# Patient Record
Sex: Female | Born: 1945 | Hispanic: No | Marital: Married
Health system: Southern US, Community
[De-identification: ages and names within clinical notes are randomized; demographics above are authoritative.]

## PROBLEM LIST (undated history)

## (undated) DIAGNOSIS — G4733 Obstructive sleep apnea (adult) (pediatric): Secondary | ICD-10-CM

## (undated) DIAGNOSIS — C109 Malignant neoplasm of oropharynx, unspecified: Secondary | ICD-10-CM

## (undated) DIAGNOSIS — J9621 Acute and chronic respiratory failure with hypoxia: Secondary | ICD-10-CM

## (undated) DIAGNOSIS — Z930 Tracheostomy status: Secondary | ICD-10-CM

## (undated) DIAGNOSIS — D869 Sarcoidosis, unspecified: Secondary | ICD-10-CM

## (undated) HISTORY — DX: Obstructive sleep apnea (adult) (pediatric): G47.33

## (undated) HISTORY — DX: Tracheostomy status: Z93.0

## (undated) HISTORY — DX: Sarcoidosis, unspecified: D86.9

## (undated) HISTORY — DX: Malignant neoplasm of oropharynx, unspecified: C10.9

## (undated) HISTORY — DX: Acute and chronic respiratory failure with hypoxia: J96.21

---

## 2020-01-03 ENCOUNTER — Inpatient Hospital Stay
Admission: RE | Admit: 2020-01-03 | Discharge: 2020-01-15 | Disposition: A | Discharge status: Skilled Nursing Facility | Payer: Medicare Other | Source: Other Acute Inpatient Hospital

## 2020-01-03 ENCOUNTER — Inpatient Hospital Stay: Admission: RE | Admit: 2020-01-03 | Payer: Medicare Other | Source: Ambulatory Visit

## 2020-01-03 ENCOUNTER — Other Ambulatory Visit (HOSPITAL_COMMUNITY): Payer: Medicare Other

## 2020-01-03 DIAGNOSIS — J189 Pneumonia, unspecified organism: Secondary | ICD-10-CM

## 2020-01-03 DIAGNOSIS — J9621 Acute and chronic respiratory failure with hypoxia: Secondary | ICD-10-CM | POA: Diagnosis present

## 2020-01-03 DIAGNOSIS — Z431 Encounter for attention to gastrostomy: Secondary | ICD-10-CM

## 2020-01-03 DIAGNOSIS — G4733 Obstructive sleep apnea (adult) (pediatric): Secondary | ICD-10-CM | POA: Diagnosis present

## 2020-01-03 DIAGNOSIS — D869 Sarcoidosis, unspecified: Secondary | ICD-10-CM | POA: Diagnosis present

## 2020-01-03 DIAGNOSIS — Z930 Tracheostomy status: Secondary | ICD-10-CM

## 2020-01-03 DIAGNOSIS — C109 Malignant neoplasm of oropharynx, unspecified: Secondary | ICD-10-CM | POA: Diagnosis present

## 2020-01-03 HISTORY — DX: Sarcoidosis, unspecified: D86.9

## 2020-01-03 HISTORY — DX: Obstructive sleep apnea (adult) (pediatric): G47.33

## 2020-01-03 HISTORY — DX: Acute and chronic respiratory failure with hypoxia: J96.21

## 2020-01-03 HISTORY — DX: Tracheostomy status: Z93.0

## 2020-01-03 HISTORY — DX: Malignant neoplasm of oropharynx, unspecified: C10.9

## 2020-01-03 MED ORDER — IOHEXOL 350 MG/ML SOLN: 50.0000 mL | Freq: Once | INTRAVENOUS | Status: DC | PRN

## 2020-01-04 LAB — COMPREHENSIVE METABOLIC PANEL
ALT: 12 U/L (ref 0–44)
AST: 20 U/L (ref 15–41)
Albumin: 2.2 g/dL — ABNORMAL LOW (ref 3.5–5.0)
Alkaline Phosphatase: 55 U/L (ref 38–126)
Anion gap: 11 (ref 5–15)
BUN: 21 mg/dL (ref 8–23)
CO2: 32 mmol/L (ref 22–32)
Calcium: 8.8 mg/dL — ABNORMAL LOW (ref 8.9–10.3)
Chloride: 103 mmol/L (ref 98–111)
Creatinine, Ser: 1.17 mg/dL — ABNORMAL HIGH (ref 0.44–1.00)
GFR calc Af Amer: 54 mL/min — ABNORMAL LOW (ref >60)
GFR calc non Af Amer: 46 mL/min — ABNORMAL LOW (ref >60)
Glucose, Bld: 133 mg/dL — ABNORMAL HIGH (ref 70–99)
Potassium: 3.5 mmol/L (ref 3.5–5.1)
Sodium: 146 mmol/L — ABNORMAL HIGH (ref 135–145)
Total Bilirubin: 0.4 mg/dL (ref 0.3–1.2)
Total Protein: 5.6 g/dL — ABNORMAL LOW (ref 6.5–8.1)

## 2020-01-04 LAB — URINALYSIS, ROUTINE W REFLEX MICROSCOPIC
Bacteria, UA: NONE SEEN
Bilirubin Urine: NEGATIVE
Glucose, UA: NEGATIVE mg/dL
Hgb urine dipstick: NEGATIVE
Ketones, ur: NEGATIVE mg/dL
Leukocytes,Ua: NEGATIVE
Nitrite: NEGATIVE
Protein, ur: 30 mg/dL — AB
Specific Gravity, Urine: 1.015 (ref 1.005–1.030)
pH: 6 (ref 5.0–8.0)

## 2020-01-04 LAB — CBC WITH DIFFERENTIAL/PLATELET
Abs Immature Granulocytes: 0.02 10*3/uL (ref 0.00–0.07)
Basophils Absolute: 0 10*3/uL (ref 0.0–0.1)
Basophils Relative: 0 %
Eosinophils Absolute: 0.2 10*3/uL (ref 0.0–0.5)
Eosinophils Relative: 3 %
HCT: 27.9 % — ABNORMAL LOW (ref 36.0–46.0)
Hemoglobin: 8.7 g/dL — ABNORMAL LOW (ref 12.0–15.0)
Immature Granulocytes: 0 %
Lymphocytes Relative: 17 %
Lymphs Abs: 1.2 10*3/uL (ref 0.7–4.0)
MCH: 29.7 pg (ref 26.0–34.0)
MCHC: 31.2 g/dL (ref 30.0–36.0)
MCV: 95.2 fL (ref 80.0–100.0)
Monocytes Absolute: 0.5 10*3/uL (ref 0.1–1.0)
Monocytes Relative: 7 %
Neutro Abs: 5.3 10*3/uL (ref 1.7–7.7)
Neutrophils Relative %: 73 %
Platelets: 179 10*3/uL (ref 150–400)
RBC: 2.93 MIL/uL — ABNORMAL LOW (ref 3.87–5.11)
RDW: 17.7 % — ABNORMAL HIGH (ref 11.5–15.5)
WBC: 7.4 10*3/uL (ref 4.0–10.5)
nRBC: 0 % (ref 0.0–0.2)

## 2020-01-04 LAB — HEMOGLOBIN A1C
Hgb A1c MFr Bld: 6 % — ABNORMAL HIGH (ref 4.8–5.6)
Mean Plasma Glucose: 125.5 mg/dL

## 2020-01-04 LAB — TSH: TSH: 3.619 u[IU]/mL (ref 0.350–4.500)

## 2020-01-04 LAB — MAGNESIUM: Magnesium: 2 mg/dL (ref 1.7–2.4)

## 2020-01-04 LAB — PHOSPHORUS: Phosphorus: 3.9 mg/dL (ref 2.5–4.6)

## 2020-01-04 LAB — LACTIC ACID, PLASMA: Lactic Acid, Venous: 0.9 mmol/L (ref 0.5–1.9)

## 2020-01-04 LAB — PROTIME-INR
INR: 1.1 (ref 0.8–1.2)
Prothrombin Time: 14.2 seconds (ref 11.4–15.2)

## 2020-01-05 LAB — URINALYSIS, ROUTINE W REFLEX MICROSCOPIC
Bilirubin Urine: NEGATIVE
Glucose, UA: NEGATIVE mg/dL
Hgb urine dipstick: NEGATIVE
Ketones, ur: NEGATIVE mg/dL
Leukocytes,Ua: NEGATIVE
Nitrite: NEGATIVE
Protein, ur: 30 mg/dL — AB
Specific Gravity, Urine: 1.013 (ref 1.005–1.030)
pH: 8 (ref 5.0–8.0)

## 2020-01-05 LAB — RENAL FUNCTION PANEL
Albumin: 2.3 g/dL — ABNORMAL LOW (ref 3.5–5.0)
Anion gap: 9 (ref 5–15)
BUN: 21 mg/dL (ref 8–23)
CO2: 33 mmol/L — ABNORMAL HIGH (ref 22–32)
Calcium: 9 mg/dL (ref 8.9–10.3)
Chloride: 104 mmol/L (ref 98–111)
Creatinine, Ser: 1.38 mg/dL — ABNORMAL HIGH (ref 0.44–1.00)
GFR calc Af Amer: 44 mL/min — ABNORMAL LOW (ref >60)
GFR calc non Af Amer: 38 mL/min — ABNORMAL LOW (ref >60)
Glucose, Bld: 133 mg/dL — ABNORMAL HIGH (ref 70–99)
Phosphorus: 4.6 mg/dL (ref 2.5–4.6)
Potassium: 3.4 mmol/L — ABNORMAL LOW (ref 3.5–5.1)
Sodium: 146 mmol/L — ABNORMAL HIGH (ref 135–145)

## 2020-01-05 LAB — CBC
HCT: 30.6 % — ABNORMAL LOW (ref 36.0–46.0)
Hemoglobin: 9.4 g/dL — ABNORMAL LOW (ref 12.0–15.0)
MCH: 29.7 pg (ref 26.0–34.0)
MCHC: 30.7 g/dL (ref 30.0–36.0)
MCV: 96.8 fL (ref 80.0–100.0)
Platelets: 206 10*3/uL (ref 150–400)
RBC: 3.16 MIL/uL — ABNORMAL LOW (ref 3.87–5.11)
RDW: 18.2 % — ABNORMAL HIGH (ref 11.5–15.5)
WBC: 6.5 10*3/uL (ref 4.0–10.5)
nRBC: 0 % (ref 0.0–0.2)

## 2020-01-05 LAB — MAGNESIUM: Magnesium: 2.3 mg/dL (ref 1.7–2.4)

## 2020-01-06 LAB — URINE CULTURE: Culture: NO GROWTH

## 2020-01-06 LAB — BASIC METABOLIC PANEL
Anion gap: 10 (ref 5–15)
BUN: 21 mg/dL (ref 8–23)
CO2: 31 mmol/L (ref 22–32)
Calcium: 9.5 mg/dL (ref 8.9–10.3)
Chloride: 100 mmol/L (ref 98–111)
Creatinine, Ser: 1.3 mg/dL — ABNORMAL HIGH (ref 0.44–1.00)
GFR calc Af Amer: 47 mL/min — ABNORMAL LOW (ref >60)
GFR calc non Af Amer: 41 mL/min — ABNORMAL LOW (ref >60)
Glucose, Bld: 139 mg/dL — ABNORMAL HIGH (ref 70–99)
Potassium: 3.7 mmol/L (ref 3.5–5.1)
Sodium: 141 mmol/L (ref 135–145)

## 2020-01-06 LAB — CULTURE, RESPIRATORY W GRAM STAIN

## 2020-01-07 LAB — BASIC METABOLIC PANEL
Anion gap: 10 (ref 5–15)
BUN: 26 mg/dL — ABNORMAL HIGH (ref 8–23)
CO2: 32 mmol/L (ref 22–32)
Calcium: 10 mg/dL (ref 8.9–10.3)
Chloride: 100 mmol/L (ref 98–111)
Creatinine, Ser: 1.37 mg/dL — ABNORMAL HIGH (ref 0.44–1.00)
GFR calc Af Amer: 44 mL/min — ABNORMAL LOW (ref >60)
GFR calc non Af Amer: 38 mL/min — ABNORMAL LOW (ref >60)
Glucose, Bld: 120 mg/dL — ABNORMAL HIGH (ref 70–99)
Potassium: 3.6 mmol/L (ref 3.5–5.1)
Sodium: 142 mmol/L (ref 135–145)

## 2020-01-09 LAB — CBC
HCT: 28.7 % — ABNORMAL LOW (ref 36.0–46.0)
Hemoglobin: 9 g/dL — ABNORMAL LOW (ref 12.0–15.0)
MCH: 29.7 pg (ref 26.0–34.0)
MCHC: 31.4 g/dL (ref 30.0–36.0)
MCV: 94.7 fL (ref 80.0–100.0)
Platelets: 201 10*3/uL (ref 150–400)
RBC: 3.03 MIL/uL — ABNORMAL LOW (ref 3.87–5.11)
RDW: 18.1 % — ABNORMAL HIGH (ref 11.5–15.5)
WBC: 5.6 10*3/uL (ref 4.0–10.5)
nRBC: 0 % (ref 0.0–0.2)

## 2020-01-09 LAB — BASIC METABOLIC PANEL
Anion gap: 9 (ref 5–15)
BUN: 28 mg/dL — ABNORMAL HIGH (ref 8–23)
CO2: 32 mmol/L (ref 22–32)
Calcium: 9.9 mg/dL (ref 8.9–10.3)
Chloride: 98 mmol/L (ref 98–111)
Creatinine, Ser: 1.41 mg/dL — ABNORMAL HIGH (ref 0.44–1.00)
GFR calc Af Amer: 43 mL/min — ABNORMAL LOW (ref >60)
GFR calc non Af Amer: 37 mL/min — ABNORMAL LOW (ref >60)
Glucose, Bld: 139 mg/dL — ABNORMAL HIGH (ref 70–99)
Potassium: 3.6 mmol/L (ref 3.5–5.1)
Sodium: 139 mmol/L (ref 135–145)

## 2020-01-09 LAB — CULTURE, BLOOD (ROUTINE X 2)
Culture: NO GROWTH
Culture: NO GROWTH
Special Requests: ADEQUATE

## 2020-01-09 LAB — MAGNESIUM: Magnesium: 2.1 mg/dL (ref 1.7–2.4)

## 2020-01-10 LAB — C DIFFICILE (CDIFF) QUICK SCRN (NO PCR REFLEX)
C Diff antigen: NEGATIVE
C Diff interpretation: NOT DETECTED
C Diff toxin: NEGATIVE

## 2020-01-11 DIAGNOSIS — C109 Malignant neoplasm of oropharynx, unspecified: Secondary | ICD-10-CM

## 2020-01-11 DIAGNOSIS — D869 Sarcoidosis, unspecified: Secondary | ICD-10-CM

## 2020-01-11 DIAGNOSIS — J9621 Acute and chronic respiratory failure with hypoxia: Secondary | ICD-10-CM

## 2020-01-11 DIAGNOSIS — G4733 Obstructive sleep apnea (adult) (pediatric): Secondary | ICD-10-CM | POA: Diagnosis not present

## 2020-01-11 DIAGNOSIS — Z930 Tracheostomy status: Secondary | ICD-10-CM

## 2020-01-11 LAB — BASIC METABOLIC PANEL
Anion gap: 10 (ref 5–15)
BUN: 22 mg/dL (ref 8–23)
CO2: 31 mmol/L (ref 22–32)
Calcium: 9.1 mg/dL (ref 8.9–10.3)
Chloride: 100 mmol/L (ref 98–111)
Creatinine, Ser: 1.28 mg/dL — ABNORMAL HIGH (ref 0.44–1.00)
GFR calc Af Amer: 48 mL/min — ABNORMAL LOW (ref >60)
GFR calc non Af Amer: 41 mL/min — ABNORMAL LOW (ref >60)
Glucose, Bld: 184 mg/dL — ABNORMAL HIGH (ref 70–99)
Potassium: 3.4 mmol/L — ABNORMAL LOW (ref 3.5–5.1)
Sodium: 141 mmol/L (ref 135–145)

## 2020-01-11 LAB — POTASSIUM: Potassium: 3.8 mmol/L (ref 3.5–5.1)

## 2020-01-11 NOTE — Consult Note (Signed)
Pulmonary [place]  Date of Service: 01/11/2020  PULMONARY CRITICAL CARE CONSULT   Caitlyn Thornton  [id number]:[id number]  DOB: 11-01-1945   DOA: 01/03/2020  Referring Physician: Merton Border, MD  HPI: Caitlyn Thornton is a 74 y.o. female seen for follow up of Acute on Chronic Respiratory Failure.  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  Patient had significant neck dissection done at that time.  Postoperatively developed a hematoma had a reexploration and evacuation of the hematoma.  Patient also had a VAC placement done at that time.  She underwent tracheostomy with the idea that the tracheostomy would remain in place.  Currently is on 28% FiO2 has been tolerating the PMV fairly well.  Review of Systems:  ROS performed and is unremarkable other than noted above.  Past medical history: Sarcoidosis OSA Type 2 diabetes Hypertension Coronary artery disease Oral cancer Dysphagia  Past surgical history: Hysterectomy Vulvectomy Oral surgery Tracheostomy Neck exploration  Social history: Positive for alcohol tobacco use smoker and chewing tobacco recently quit No drug abuse  Family history: Noncontributory to the present illness  Medications: Reviewed on Rounds  Physical Exam:  Vitals: Temperature is 96.9 pulse 77 respiratory rate 20 blood pressure is 112/64 saturations 98%  Ventilator Settings on T collar with an FiO2 of 28% using PMV  . General: Comfortable at this time . Eyes: Grossly normal lids, irises & conjunctiva . ENT: grossly tongue is normal . Neck: no obvious mass . Cardiovascular: S1-S2  normal no gallop or rub . Respiratory: No rhonchi no rales are noted at this time . Abdomen: Soft and nontender . Skin: no rash seen on limited exam . Musculoskeletal: not rigid . Psychiatric:unable to assess . Neurologic: no seizure no involuntary movements         Labs on Admission:  Basic Metabolic Panel: Recent Labs  Lab 01/05/20 0411 01/05/20 0411 01/06/20 0704 01/07/20 0531 01/09/20 0423 01/11/20 0548 01/11/20 1341  NA 146*  --  141 142 139 141  --   K 3.4*   < > 3.7 3.6 3.6 3.4* 3.8  CL 104  --  100 100 98 100  --   CO2 33*  --  31 32 32 31  --   GLUCOSE 133*  --  139* 120* 139* 184*  --   BUN 21  --  21 26* 28* 22  --   CREATININE 1.38*  --  1.30* 1.37* 1.41* 1.28*  --   CALCIUM 9.0  --  9.5 10.0 9.9 9.1  --   MG 2.3  --   --   --  2.1  --   --   PHOS 4.6  --   --   --   --   --   --    < > = values in this interval not displayed.    No results for input(s): PHART, PCO2ART, PO2ART, HCO3, O2SAT in the last 168 hours.  Liver Function Tests: Recent Labs  Lab 01/05/20 0411  ALBUMIN 2.3*   No results for input(s): LIPASE, AMYLASE in the last 168 hours. No results for input(s): AMMONIA in the last 168 hours.  CBC: Recent Labs  Lab 01/05/20 0411 01/09/20 0423  WBC 6.5 5.6  HGB 9.4* 9.0*  HCT 30.6* 28.7*  MCV 96.8 94.7  PLT 206 201    Cardiac Enzymes: No results for input(s): CKTOTAL, CKMB, CKMBINDEX, TROPONINI in the last 168 hours.  BNP (last 3 results) No results for input(s): BNP in the last 8760 hours.  ProBNP (last 3 results) No results for input(s): PROBNP in the last 8760 hours.   Radiological Exams on Admission: No results found.  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 Acute on chronic respiratory failure hypoxia patient is on the PMV right now requiring 28% FiO2 and also is  tolerating the PMV fairly well.  Continue with T collar continue secretion management 2. Tracheostomy status the tracheostomy will remain in place we will continue to monitor secretion issues.  Continue with supportive care. 3. OSA nonissue with tracheostomy in place. 4. Sarcoidosis appears to be stable at this time we will continue to monitor closely. 5. Oropharyngeal carcinoma status post neck dissection resection  I have personally seen and evaluated the patient, evaluated laboratory and imaging results, formulated the assessment and plan and placed orders. The Patient requires high complexity decision making with multiple systems involvement.  Case was discussed on Rounds with the Respiratory Therapy Director and the Respiratory staff Time Spent 17minutes  Dorinda Stehr A Elie Gragert, MD [facility] Pulmonary Critical Care Medicine Sleep Medicine

## 2020-01-12 DIAGNOSIS — Z930 Tracheostomy status: Secondary | ICD-10-CM

## 2020-01-12 DIAGNOSIS — D869 Sarcoidosis, unspecified: Secondary | ICD-10-CM | POA: Diagnosis not present

## 2020-01-12 DIAGNOSIS — J9621 Acute and chronic respiratory failure with hypoxia: Secondary | ICD-10-CM | POA: Diagnosis present

## 2020-01-12 DIAGNOSIS — C109 Malignant neoplasm of oropharynx, unspecified: Secondary | ICD-10-CM | POA: Diagnosis not present

## 2020-01-12 DIAGNOSIS — G4733 Obstructive sleep apnea (adult) (pediatric): Secondary | ICD-10-CM | POA: Diagnosis present

## 2020-01-12 NOTE — Progress Notes (Signed)
Pulmonary Critical Care Medicine [place]   PULMONARY CRITICAL CARE SERVICE  PROGRESS NOTE  Date of Service: 01/12/2020  Rhonna Holster  PZW:[id number]  DOB: 1946-05-08   DOA: 01/03/2020  Referring Physician: Merton Border, MD  HPI: Tashira Torre is a 74 y.o. female seen for follow up of Acute on Chronic Respiratory Failure.  Patient currently is on T collar and is tolerating the PMV doing well so far  Medications: Reviewed on Rounds  Physical Exam:  Vitals: Temperature is 96.9 pulse 72 respiratory rate 20 blood pressure is 117/64 saturations 98%  Ventilator Settings on T collar with an FiO2 of 28% using PMV  . General: Comfortable at this time . Eyes: Grossly normal lids, irises & conjunctiva . ENT: grossly tongue is normal . Neck: no obvious mass . Cardiovascular: S1 S2 normal no gallop . Respiratory: No rhonchi coarse breath sounds are noted . Abdomen: soft . Skin: no rash seen on limited exam . Musculoskeletal: not rigid . Psychiatric:unable to assess . Neurologic: no seizure no involuntary movements         Lab Data:   Basic Metabolic Panel: Recent Labs  Lab 01/06/20 0704 01/07/20 0531 01/09/20 0423 01/11/20 0548 01/11/20 1341  NA 141 142 139 141  --   K 3.7 3.6 3.6 3.4* 3.8  CL 100 100 98 100  --   CO2 31 32 32 31  --   GLUCOSE 139* 120* 139* 184*  --   BUN 21 26* 28* 22  --   CREATININE 1.30* 1.37* 1.41* 1.28*  --   CALCIUM 9.5 10.0 9.9 9.1  --   MG  --   --  2.1  --   --     ABG: No results for input(s): PHART, PCO2ART, PO2ART, HCO3, O2SAT in the last 168 hours.  Liver Function Tests: No results for input(s): AST, ALT, ALKPHOS, BILITOT, PROT, ALBUMIN in the last 168 hours. No results for input(s): LIPASE, AMYLASE in the last 168 hours. No results for input(s): AMMONIA in the last 168 hours.  CBC: Recent Labs  Lab 01/09/20 0423  WBC 5.6  HGB 9.0*  HCT 28.7*  MCV 94.7  PLT 201    Cardiac Enzymes: No results for  input(s): CKTOTAL, CKMB, CKMBINDEX, TROPONINI in the last 168 hours.  BNP (last 3 results) No results for input(s): BNP in the last 8760 hours.  ProBNP (last 3 results) No results for input(s): PROBNP in the last 8760 hours.  Radiological Exams: No results found.  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 Acute on chronic respiratory failure hypoxia we will continue with T collar trials on 28% FiO2 good saturations and is also tolerating the PMV. 2. Obstructive sleep apnea nonissue right now with tracheostomy in place 3. Tracheostomy remains in place because of the extensive dissection adenocarcinoma 4. Sarcoidosis at baseline 5. Oropharyngeal carcinoma status post resection   I have personally seen and evaluated the patient, evaluated laboratory and imaging results, formulated the assessment and plan and placed orders. The Patient requires high complexity decision making with multiple systems involvement.  Rounds were done with the Respiratory Therapy Director and Staff therapists and discussed with nursing staff also.  Allyne Gee, MD [facility] Pulmonary Critical Care Medicine Sleep Medicine

## 2020-01-13 DIAGNOSIS — C109 Malignant neoplasm of oropharynx, unspecified: Secondary | ICD-10-CM | POA: Diagnosis not present

## 2020-01-13 DIAGNOSIS — G4733 Obstructive sleep apnea (adult) (pediatric): Secondary | ICD-10-CM | POA: Diagnosis not present

## 2020-01-13 DIAGNOSIS — J9621 Acute and chronic respiratory failure with hypoxia: Secondary | ICD-10-CM | POA: Diagnosis not present

## 2020-01-13 DIAGNOSIS — D869 Sarcoidosis, unspecified: Secondary | ICD-10-CM | POA: Diagnosis not present

## 2020-01-13 LAB — BASIC METABOLIC PANEL
Anion gap: 10 (ref 5–15)
BUN: 23 mg/dL (ref 8–23)
CO2: 31 mmol/L (ref 22–32)
Calcium: 9.2 mg/dL (ref 8.9–10.3)
Chloride: 99 mmol/L (ref 98–111)
Creatinine, Ser: 1.45 mg/dL — ABNORMAL HIGH (ref 0.44–1.00)
GFR calc Af Amer: 41 mL/min — ABNORMAL LOW (ref >60)
GFR calc non Af Amer: 36 mL/min — ABNORMAL LOW (ref >60)
Glucose, Bld: 116 mg/dL — ABNORMAL HIGH (ref 70–99)
Potassium: 3.7 mmol/L (ref 3.5–5.1)
Sodium: 140 mmol/L (ref 135–145)

## 2020-01-13 LAB — CBC
HCT: 29.1 % — ABNORMAL LOW (ref 36.0–46.0)
Hemoglobin: 9 g/dL — ABNORMAL LOW (ref 12.0–15.0)
MCH: 29.7 pg (ref 26.0–34.0)
MCHC: 30.9 g/dL (ref 30.0–36.0)
MCV: 96 fL (ref 80.0–100.0)
Platelets: 224 10*3/uL (ref 150–400)
RBC: 3.03 MIL/uL — ABNORMAL LOW (ref 3.87–5.11)
RDW: 17.7 % — ABNORMAL HIGH (ref 11.5–15.5)
WBC: 5.9 10*3/uL (ref 4.0–10.5)
nRBC: 0 % (ref 0.0–0.2)

## 2020-01-13 LAB — MAGNESIUM: Magnesium: 2.2 mg/dL (ref 1.7–2.4)

## 2020-01-13 NOTE — Progress Notes (Signed)
Pulmonary Critical Care Medicine [place]   PULMONARY CRITICAL CARE SERVICE  PROGRESS NOTE  Date of Service: 01/13/2020  Caitlyn Thornton  [id number]  DOB: 1946-03-31   DOA: 01/03/2020  Referring Physician: Merton Border, MD  HPI: Caitlyn Thornton is a 74 y.o. female seen for follow up of Acute on Chronic Respiratory Failure.  Remains on T collar right now on 28% FiO2 secretions are very moderate.  Medications: Reviewed on Rounds  Physical Exam:  Vitals: Temperature is 96.3 pulse 82 respiratory 18 blood pressure is 105/64 saturations 97%  Ventilator Settings on T collar FiO2 28%  . General: Comfortable at this time . Eyes: Grossly normal lids, irises & conjunctiva . ENT: grossly tongue is normal . Neck: no obvious mass . Cardiovascular: S1 S2 normal no gallop . Respiratory: No rhonchi coarse breath sounds are noted . Abdomen: soft . Skin: no rash seen on limited exam . Musculoskeletal: not rigid . Psychiatric:unable to assess . Neurologic: no seizure no involuntary movements         Lab Data:   Basic Metabolic Panel: Recent Labs  Lab 01/07/20 0531 01/09/20 0423 01/11/20 0548 01/11/20 1341 01/13/20 0553  NA 142 139 141  --  140  K 3.6 3.6 3.4* 3.8 3.7  CL 100 98 100  --  99  CO2 32 32 31  --  31  GLUCOSE 120* 139* 184*  --  116*  BUN 26* 28* 22  --  23  CREATININE 1.37* 1.41* 1.28*  --  1.45*  CALCIUM 10.0 9.9 9.1  --  9.2  MG  --  2.1  --   --  2.2    ABG: No results for input(s): PHART, PCO2ART, PO2ART, HCO3, O2SAT in the last 168 hours.  Liver Function Tests: No results for input(s): AST, ALT, ALKPHOS, BILITOT, PROT, ALBUMIN in the last 168 hours. No results for input(s): LIPASE, AMYLASE in the last 168 hours. No results for input(s): AMMONIA in the last 168 hours.  CBC: Recent Labs  Lab 01/09/20 0423 01/13/20 0553  WBC 5.6 5.9  HGB 9.0* 9.0*  HCT 28.7* 29.1*  MCV 94.7 96.0  PLT 201 224    Cardiac Enzymes: No  results for input(s): CKTOTAL, CKMB, CKMBINDEX, TROPONINI in the last 168 hours.  BNP (last 3 results) No results for input(s): BNP in the last 8760 hours.  ProBNP (last 3 results) No results for input(s): PROBNP in the last 8760 hours.  Radiological Exams: No results found.  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 Acute on chronic respiratory failure hypoxia we will continue with T collar titrate oxygen as tolerated 2. 6 obstructive sleep apnea nonissue patient has an airway in place 3. Sarcoidosis at baseline 4. Oropharyngeal carcinoma status post resection prognosis guarded 5. Tracheostomy will need to remain in place   I have personally seen and evaluated the patient, evaluated laboratory and imaging results, formulated the assessment and plan and placed orders. The Patient requires high complexity decision making with multiple systems involvement.  Rounds were done with the Respiratory Therapy Director and Staff therapists and discussed with nursing staff also.  Allyne Gee, MD [facility] Pulmonary Critical Care Medicine Sleep Medicine

## 2020-01-14 DIAGNOSIS — D869 Sarcoidosis, unspecified: Secondary | ICD-10-CM | POA: Diagnosis not present

## 2020-01-14 DIAGNOSIS — G4733 Obstructive sleep apnea (adult) (pediatric): Secondary | ICD-10-CM | POA: Diagnosis not present

## 2020-01-14 DIAGNOSIS — C109 Malignant neoplasm of oropharynx, unspecified: Secondary | ICD-10-CM | POA: Diagnosis not present

## 2020-01-14 DIAGNOSIS — J9621 Acute and chronic respiratory failure with hypoxia: Secondary | ICD-10-CM | POA: Diagnosis not present

## 2020-01-14 NOTE — Progress Notes (Signed)
Pulmonary Critical Care Medicine [place]   PULMONARY CRITICAL CARE SERVICE  PROGRESS NOTE  Date of Service: 01/14/2020  Caitlyn Thornton  [id number]:[id number]  DOB: 11/16/1945   DOA: 01/03/2020  Referring Physician: Merton Border, MD  HPI: Caitlyn Thornton is a 74 y.o. female seen for follow up of Acute on Chronic Respiratory Failure.  Patient currently is on T collar has been on 28% FiO2 has been using PMV without any major distress noted.  Medications: Reviewed on Rounds  Physical Exam:  Vitals: Temperature 96.9 pulse 92 respiratory 20 blood pressure is 118/67 saturations 97%  Ventilator Settings on T collar FiO2 28%   General: Comfortable at this time  Eyes: Grossly normal lids, irises & conjunctiva  ENT: grossly tongue is normal  Neck: no obvious mass  Cardiovascular: S1 S2 normal no gallop  Respiratory: No rhonchi coarse breath sounds  Abdomen: soft  Skin: no rash seen on limited exam  Musculoskeletal: not rigid  Psychiatric:unable to assess  Neurologic: no seizure no involuntary movements         Lab Data:   Basic Metabolic Panel: Recent Labs  Lab 01/09/20 0423 01/11/20 0548 01/11/20 1341 01/13/20 0553  NA 139 141  --  140  K 3.6 3.4* 3.8 3.7  CL 98 100  --  99  CO2 32 31  --  31  GLUCOSE 139* 184*  --  116*  BUN 28* 22  --  23  CREATININE 1.41* 1.28*  --  1.45*  CALCIUM 9.9 9.1  --  9.2  MG 2.1  --   --  2.2    ABG: No results for input(s): PHART, PCO2ART, PO2ART, HCO3, O2SAT in the last 168 hours.  Liver Function Tests: No results for input(s): AST, ALT, ALKPHOS, BILITOT, PROT, ALBUMIN in the last 168 hours. No results for input(s): LIPASE, AMYLASE in the last 168 hours. No results for input(s): AMMONIA in the last 168 hours.  CBC: Recent Labs  Lab 01/09/20 0423 01/13/20 0553  WBC 5.6 5.9  HGB 9.0* 9.0*  HCT 28.7* 29.1*  MCV 94.7 96.0  PLT 201 224    Cardiac Enzymes: No results for input(s): CKTOTAL, CKMB,  CKMBINDEX, TROPONINI in the last 168 hours.  BNP (last 3 results) No results for input(s): BNP in the last 8760 hours.  ProBNP (last 3 results) No results for input(s): PROBNP in the last 8760 hours.  Radiological Exams: No results found.  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 Acute on chronic respiratory failure with hypoxia patient is doing fine on T collar 28% FiO2.  Patient also is tolerating the PMV fairly well.  We will continue with secretion management supportive care.  Patient secretions have been somewhat of an issue still 2. Tracheostomy will remain in place no plan on decannulation 3. Obstructive sleep apnea nonissue with tracheostomy 4. Sarcoidosis no change 5. Oropharyngeal carcinoma status post extensive resection we will continue with supportive care   I have personally seen and evaluated the patient, evaluated laboratory and imaging results, formulated the assessment and plan and placed orders. The Patient requires high complexity decision making with multiple systems involvement.  Rounds were done with the Respiratory Therapy Director and Staff therapists and discussed with nursing staff also.  Allyne Gee, MD [facility] Pulmonary Critical Care Medicine Sleep Medicine

## 2020-01-15 DIAGNOSIS — D869 Sarcoidosis, unspecified: Secondary | ICD-10-CM | POA: Diagnosis not present

## 2020-01-15 DIAGNOSIS — J9621 Acute and chronic respiratory failure with hypoxia: Secondary | ICD-10-CM | POA: Diagnosis not present

## 2020-01-15 DIAGNOSIS — G4733 Obstructive sleep apnea (adult) (pediatric): Secondary | ICD-10-CM | POA: Diagnosis not present

## 2020-01-15 DIAGNOSIS — C109 Malignant neoplasm of oropharynx, unspecified: Secondary | ICD-10-CM | POA: Diagnosis not present

## 2020-01-15 LAB — MAGNESIUM: Magnesium: 2.2 mg/dL (ref 1.7–2.4)

## 2020-01-15 LAB — RENAL FUNCTION PANEL
Albumin: 2.6 g/dL — ABNORMAL LOW (ref 3.5–5.0)
Anion gap: 11 (ref 5–15)
BUN: 27 mg/dL — ABNORMAL HIGH (ref 8–23)
CO2: 31 mmol/L (ref 22–32)
Calcium: 10.1 mg/dL (ref 8.9–10.3)
Chloride: 98 mmol/L (ref 98–111)
Creatinine, Ser: 1.44 mg/dL — ABNORMAL HIGH (ref 0.44–1.00)
GFR calc Af Amer: 42 mL/min — ABNORMAL LOW (ref >60)
GFR calc non Af Amer: 36 mL/min — ABNORMAL LOW (ref >60)
Glucose, Bld: 119 mg/dL — ABNORMAL HIGH (ref 70–99)
Phosphorus: 4.2 mg/dL (ref 2.5–4.6)
Potassium: 3.7 mmol/L (ref 3.5–5.1)
Sodium: 140 mmol/L (ref 135–145)

## 2020-01-15 LAB — CBC
HCT: 30.6 % — ABNORMAL LOW (ref 36.0–46.0)
Hemoglobin: 9.7 g/dL — ABNORMAL LOW (ref 12.0–15.0)
MCH: 30.2 pg (ref 26.0–34.0)
MCHC: 31.7 g/dL (ref 30.0–36.0)
MCV: 95.3 fL (ref 80.0–100.0)
Platelets: 232 10*3/uL (ref 150–400)
RBC: 3.21 MIL/uL — ABNORMAL LOW (ref 3.87–5.11)
RDW: 17.4 % — ABNORMAL HIGH (ref 11.5–15.5)
WBC: 8.2 10*3/uL (ref 4.0–10.5)
nRBC: 0 % (ref 0.0–0.2)

## 2020-01-15 LAB — SARS CORONAVIRUS 2 (TAT 6-24 HRS): SARS Coronavirus 2: NEGATIVE

## 2020-01-15 NOTE — Progress Notes (Signed)
Pulmonary Critical Care Medicine [place]   PULMONARY CRITICAL CARE SERVICE  PROGRESS NOTE  Date of Service: 01/15/2020  Ireene Ballowe  [id number]  DOB: [birth year]   DOA: 01/03/2020  Referring Physician: Merton Border, MD  HPI: Izadora Roehr is a 74 y.o. female seen for follow up of Acute on Chronic Respiratory Failure.  Patient currently is on T collar has been on 28% FiO2 we should be able to try capping today  Medications: Reviewed on Rounds  Physical Exam:  Vitals: Temperature is 96.2 pulse 97 respiratory rate 24 blood pressure is 149/83 saturations 94%  Ventilator Settings on T collar FiO2 is 28%  . General: Comfortable at this time . Eyes: Grossly normal lids, irises & conjunctiva . ENT: grossly tongue is normal . Neck: no obvious mass . Cardiovascular: S1 S2 normal no gallop . Respiratory: Scattered rhonchi expansion is equal distant . Abdomen: soft . Skin: no rash seen on limited exam . Musculoskeletal: not rigid . Psychiatric:unable to assess . Neurologic: no seizure no involuntary movements         Lab Data:   Basic Metabolic Panel: Recent Labs  Lab 01/09/20 0423 01/11/20 0548 01/11/20 1341 01/13/20 0553  NA 139 141  --  140  K 3.6 3.4* 3.8 3.7  CL 98 100  --  99  CO2 32 31  --  31  GLUCOSE 139* 184*  --  116*  BUN 28* 22  --  23  CREATININE 1.41* 1.28*  --  1.45*  CALCIUM 9.9 9.1  --  9.2  MG 2.1  --   --  2.2    ABG: No results for input(s): PHART, PCO2ART, PO2ART, HCO3, O2SAT in the last 168 hours.  Liver Function Tests: No results for input(s): AST, ALT, ALKPHOS, BILITOT, PROT, ALBUMIN in the last 168 hours. No results for input(s): LIPASE, AMYLASE in the last 168 hours. No results for input(s): AMMONIA in the last 168 hours.  CBC: Recent Labs  Lab 01/09/20 0423 01/13/20 0553  WBC 5.6 5.9  HGB 9.0* 9.0*  HCT 28.7* 29.1*  MCV 94.7 96.0  PLT 201 224    Cardiac Enzymes: No results for input(s):  CKTOTAL, CKMB, CKMBINDEX, TROPONINI in the last 168 hours.  BNP (last 3 results) No results for input(s): BNP in the last 8760 hours.  ProBNP (last 3 results) No results for input(s): PROBNP in the last 8760 hours.  Radiological Exams: No results found.  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 Acute on chronic respiratory failure hypoxia we will continue with T collar patient is on 28% FiO2.  She is not a candidate for decannulation however capping can be considered 2. Tracheostomy remains in place right now will attempt capping but no plan on decannulation 3. Obstructive sleep apnea no issues 4. Sarcoidosis at baseline we will continue with supportive care 5. Oropharyngeal carcinoma status post neck dissection   I have personally seen and evaluated the patient, evaluated laboratory and imaging results, formulated the assessment and plan and placed orders. The Patient requires high complexity decision making with multiple systems involvement.  Rounds were done with the Respiratory Therapy Director and Staff therapists and discussed with nursing staff also.  Allyne Gee, MD [facility] Pulmonary Critical Care Medicine Sleep Medicine

## 2021-11-23 IMAGING — CT CT ABDOMEN W/O CM
2 of 4 series · 15 of 46 positions shown, 17 images · non-contrast
Comparison: None.

CLINICAL DATA: Confirm percutaneous gastrostomy tube positioning.

EXAM:
CT ABDOMEN WITHOUT CONTRAST
TECHNIQUE: Multidetector CT imaging of the abdomen was performed following the
standard protocol without IV contrast.

[Series 3: a/p w/o 5mm · axial · non-contrast · 0.98mm/px · z∈[+1378,+1668]mm · 12 of 64 slices shown, 14 images]
[im 3/64  soft-tissue]
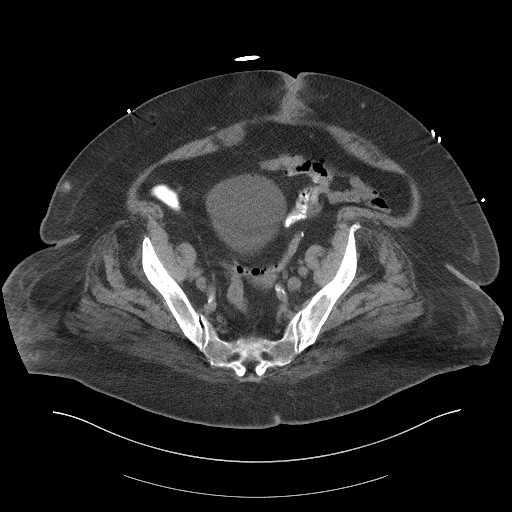
[im 3/64  bone]
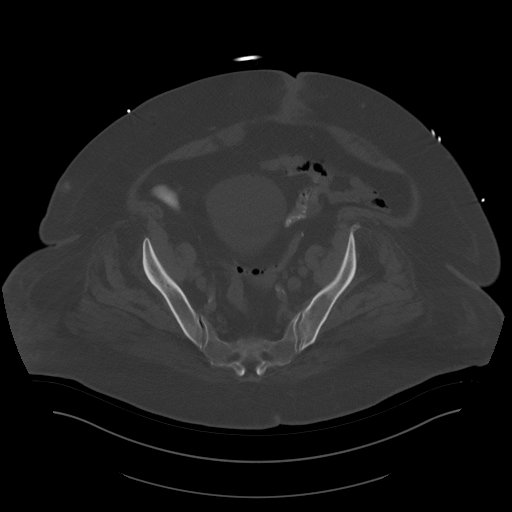
[im 9/64  soft-tissue]
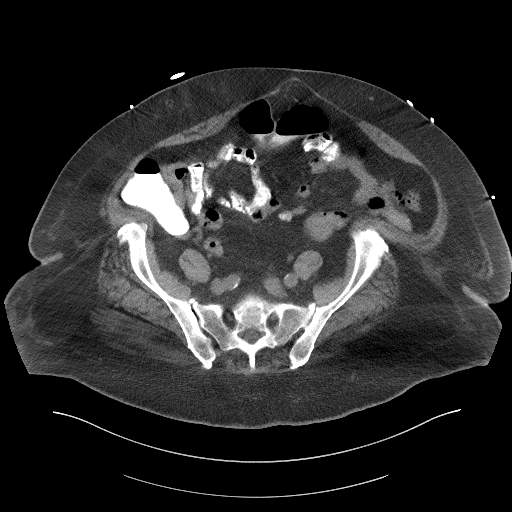
[im 15/64  soft-tissue]
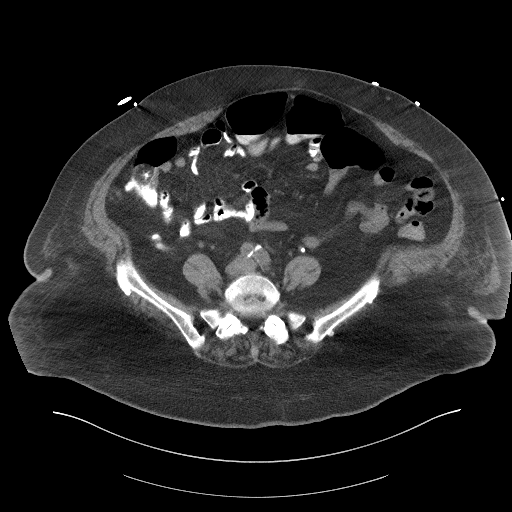
[im 21/64  soft-tissue]
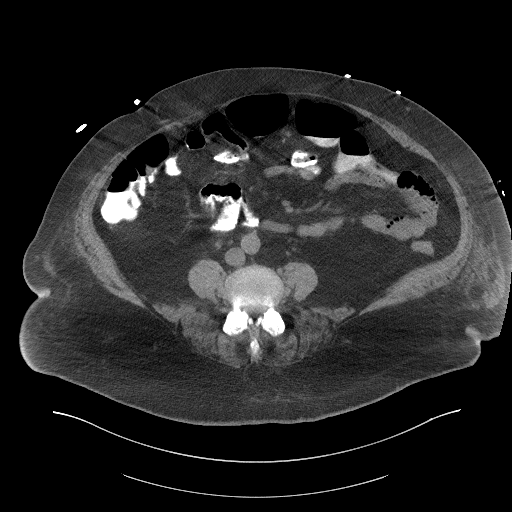
[im 23/64  soft-tissue]
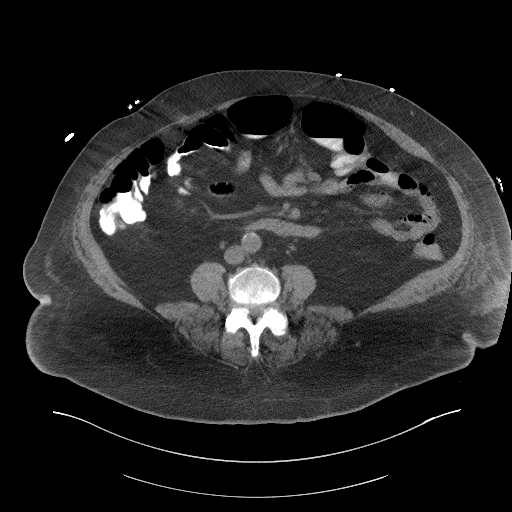
[im 29/64  soft-tissue]
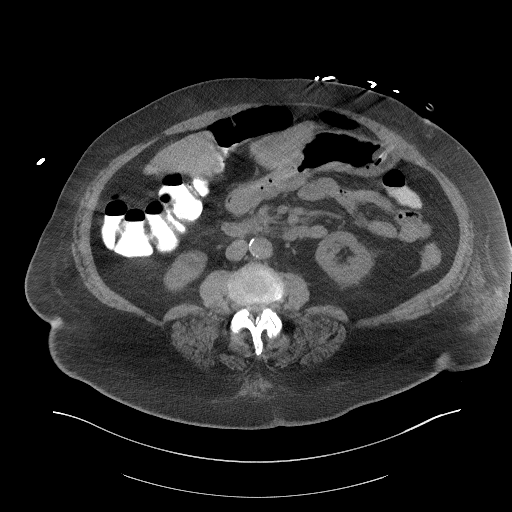
[im 35/64  soft-tissue]
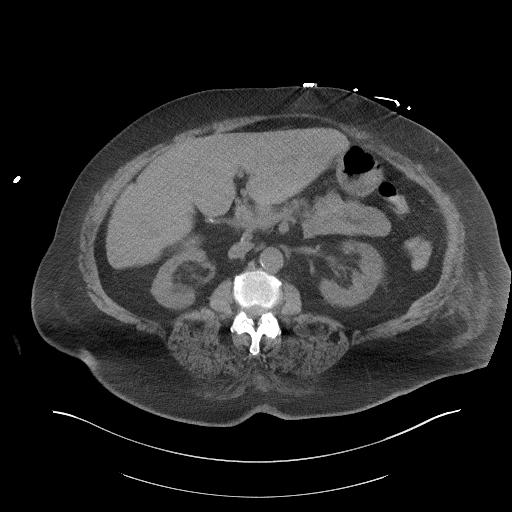
[im 41/64  soft-tissue]
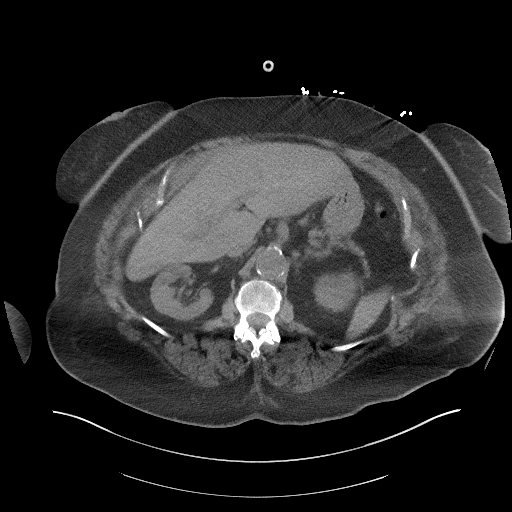
[im 43/64  soft-tissue]
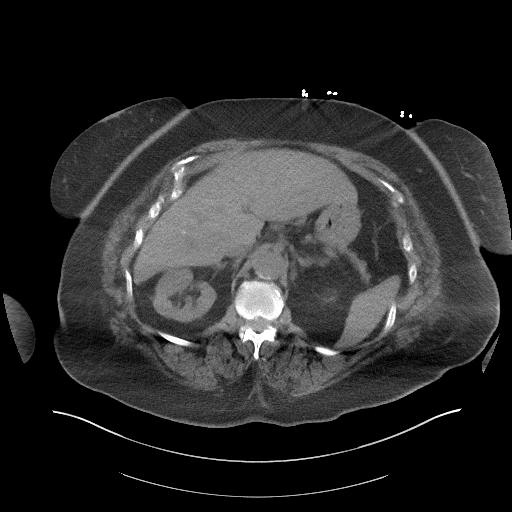
[im 43/64  bone]
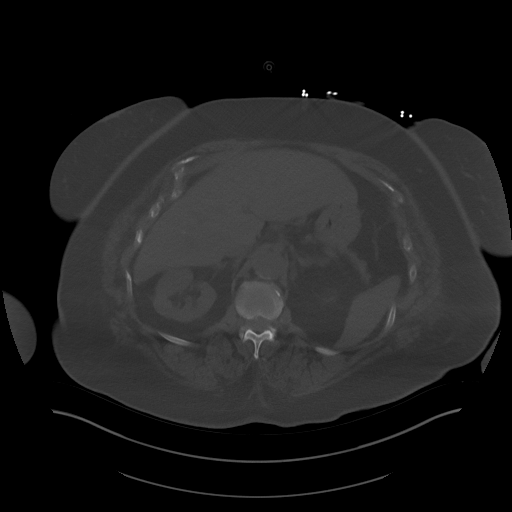
[im 49/64  soft-tissue]
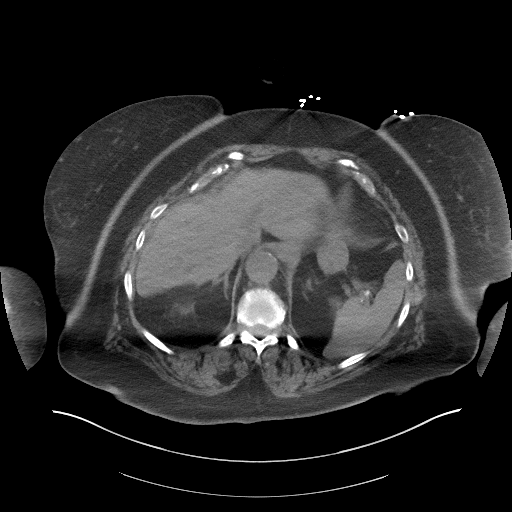
[im 55/64  soft-tissue]
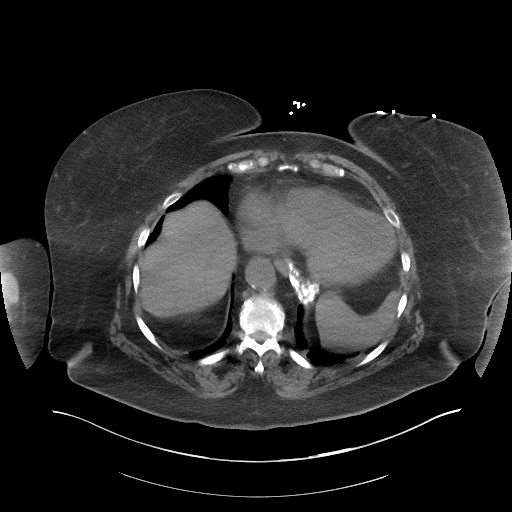
[im 61/64  soft-tissue]
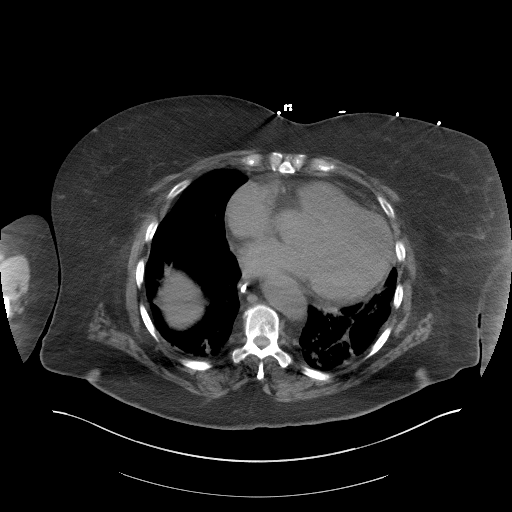

[Series 6: a/p w/o cor · coronal · non-contrast · 0.62mm/px · 3 of 194 slices shown]
[im 65/194  soft-tissue]
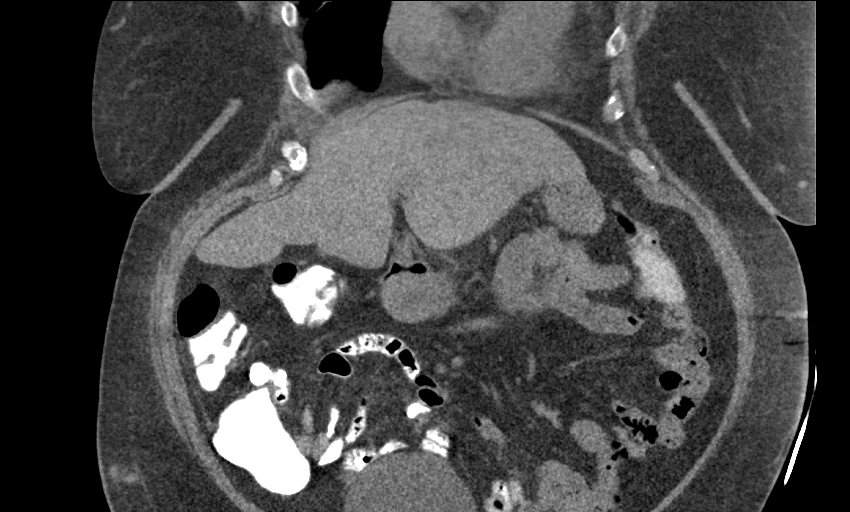
[im 86/194  soft-tissue]
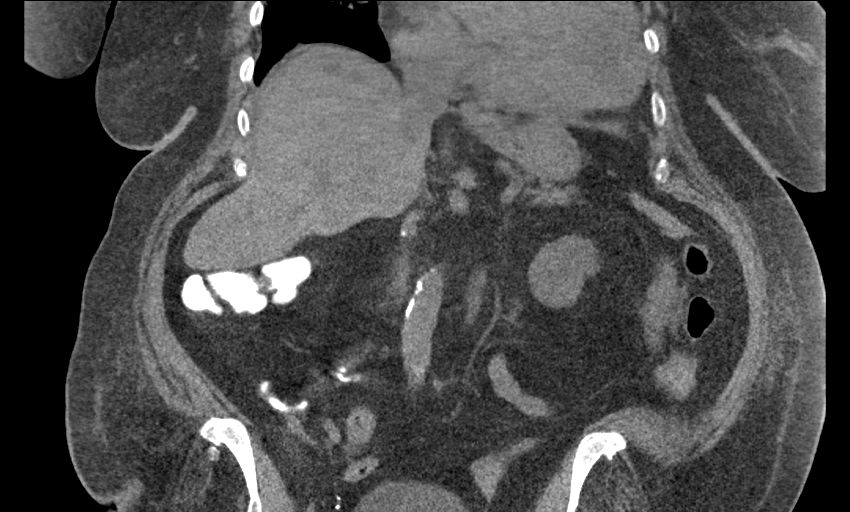
[im 108/194  soft-tissue]
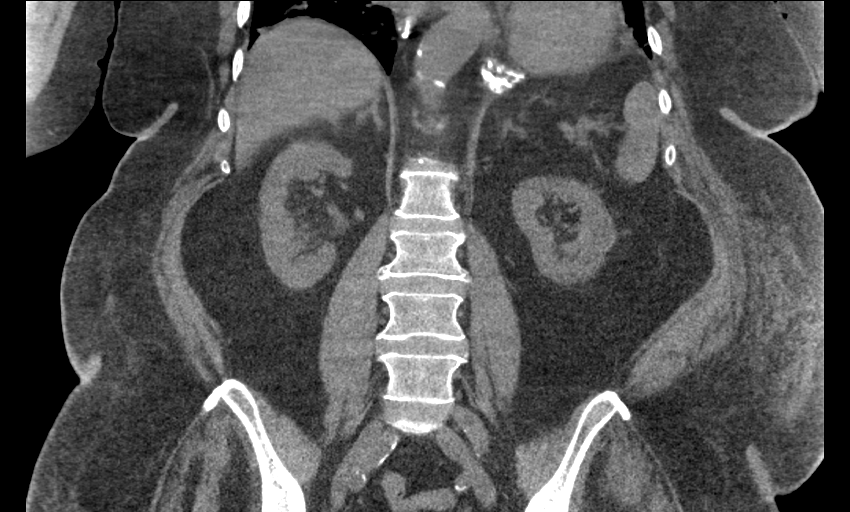

[15 of 46 positions shown; findings below may reference images not displayed]

FINDINGS: Lower chest: Marked severity atelectasis and/or infiltrate is seen
within the bilateral lung bases.

Clusters of calcified soft tissue masses are seen within the
visualized portion of the lower mediastinum.

Hepatobiliary: No focal liver abnormality is seen. The gallbladder
is not identified. No biliary dilatation.

Pancreas: Unremarkable. No pancreatic ductal dilatation or
surrounding inflammatory changes.

Spleen: Normal in size without focal abnormality.

Adrenals/Urinary Tract: Adrenal glands are unremarkable. Kidneys are
normal, without renal calculi, focal lesion, or hydronephrosis.

Stomach/Bowel: A percutaneous gastrostomy tube is seen with its
distal tip and insufflator bulb noted within the body of the
stomach. Stomach is within normal limits. Appendix appears normal.
No evidence of bowel wall thickening, distention, or inflammatory
changes.

Vascular/Lymphatic: There is mild to moderate severity calcification
of the abdominal aorta. No enlarged abdominal lymph nodes.

Other: No abdominal wall hernia or abnormality.

Musculoskeletal: Degenerative changes seen in the lower lumbar
spine, most prominent at the level of L5-S1.
IMPRESSION: 1. Marked severity atelectasis and/or infiltrate within the
bilateral lung bases.
2. Clusters of calcified soft tissue masses within the visualized
portion of the lower mediastinum which may be secondary to prior
granulomatous disease.
3. Percutaneous gastrostomy tube with its distal tip and insufflator
bulb noted within the body of the stomach.
4. Aortic atherosclerosis.

Aortic Atherosclerosis ([id number]).

## 2021-11-23 IMAGING — DX DG CHEST 1V PORT
1 series · 1 of 1 positions shown · non-contrast
Comparison: None.

CLINICAL DATA: Tracheostomy.

EXAM:
PORTABLE CHEST 1 VIEW

[chest ap]
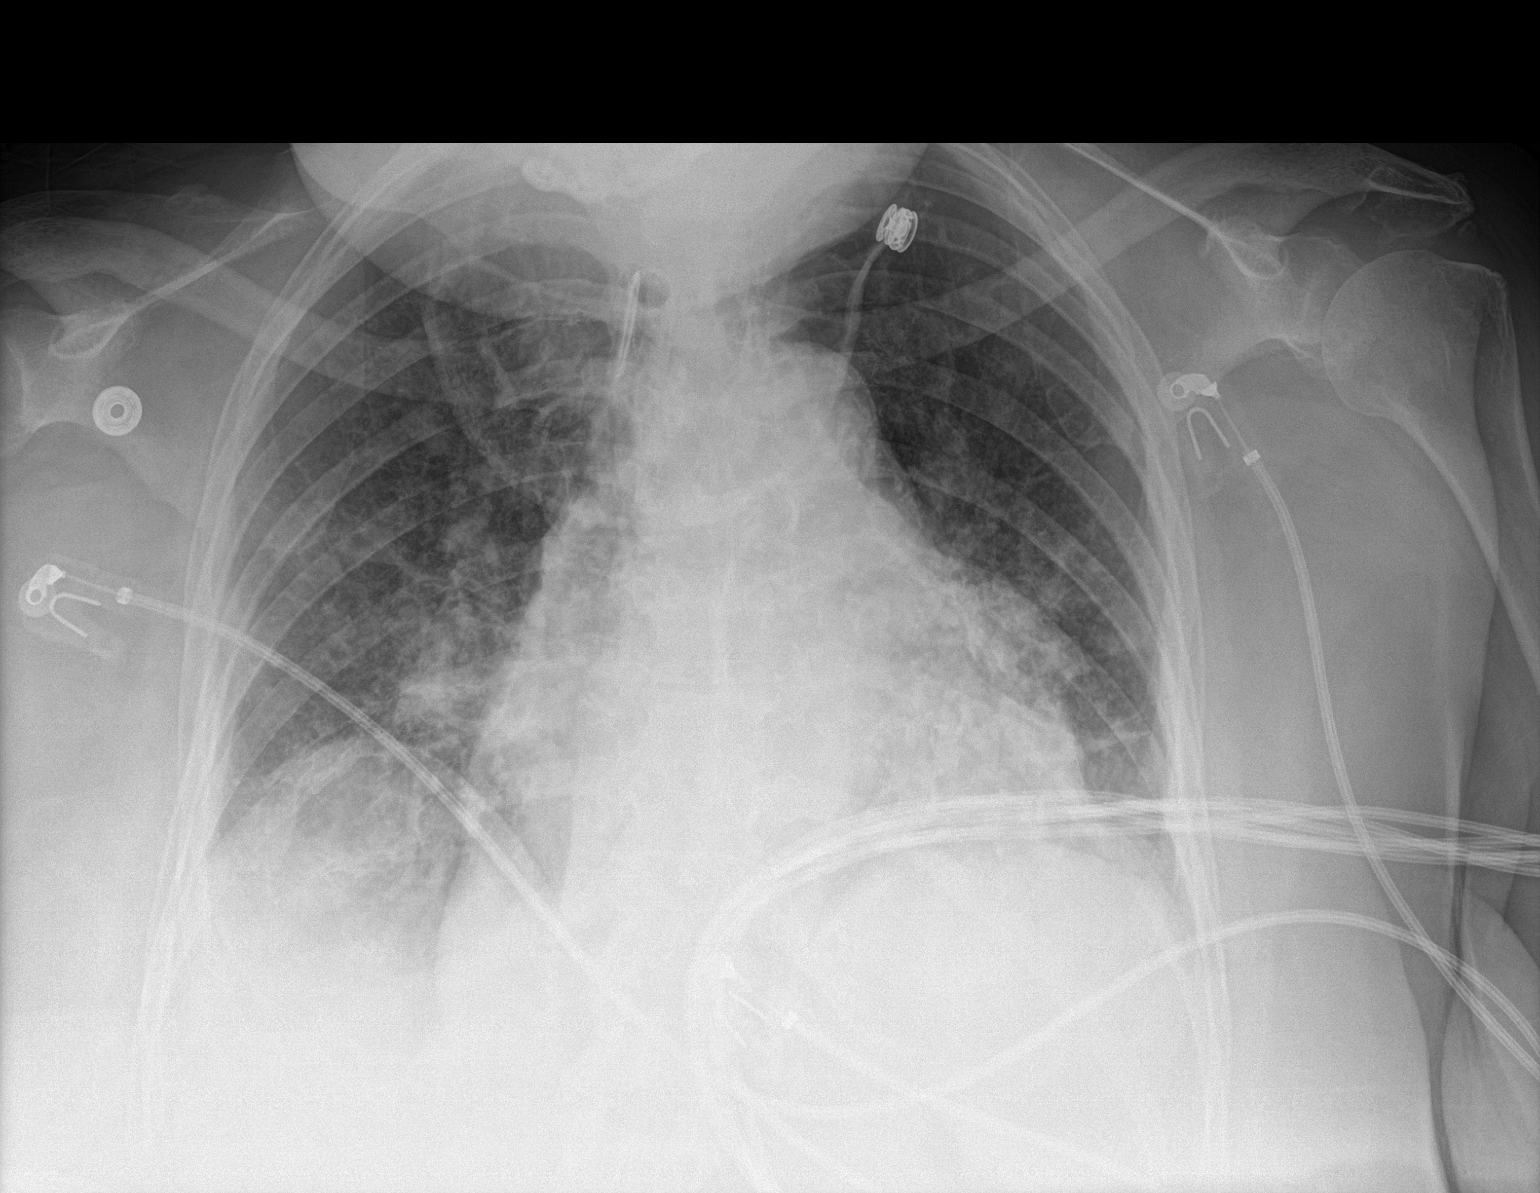

[1 of 1 positions shown; findings below may reference images not displayed]

FINDINGS: Stable cardiomegaly. Tracheostomy tube is seen projected over
superior mediastinum, but it cannot be said definitively that is
within the tracheal lumen. Lateral projection or CT scan is
recommended for further evaluation. Mild bibasilar atelectasis or
infiltrates are noted. No pneumothorax or pleural effusion is noted.
Bony thorax is unremarkable.
IMPRESSION: Tracheostomy tube is seen projected over superior mediastinum, but
it cannot be said definitively that is within the tracheal lumen.
Lateral projection or CT scan is recommended for further evaluation.
Mild bibasilar atelectasis or infiltrates are noted.
